# Patient Record
Sex: Male | Born: 1990
Health system: Southern US, Community
[De-identification: ages and names within clinical notes are randomized; demographics above are authoritative.]

## PROBLEM LIST (undated history)

## (undated) DIAGNOSIS — E7800 Pure hypercholesterolemia, unspecified: Secondary | ICD-10-CM

---

## 2012-06-09 ENCOUNTER — Ambulatory Visit
Admission: RE | Admit: 2012-06-09 | Discharge: 2012-06-09 | Disposition: A | Discharge status: Home / Self Care | Payer: No Typology Code available for payment source | Source: Ambulatory Visit | Attending: Occupational Medicine | Admitting: Occupational Medicine

## 2012-06-09 ENCOUNTER — Other Ambulatory Visit: Payer: Self-pay | Admitting: Occupational Medicine

## 2012-06-09 DIAGNOSIS — Z0000 Encounter for general adult medical examination without abnormal findings: Secondary | ICD-10-CM

## 2013-06-30 ENCOUNTER — Emergency Department (HOSPITAL_COMMUNITY): Payer: Medicaid - Out of State

## 2013-06-30 ENCOUNTER — Emergency Department (HOSPITAL_COMMUNITY)
Admission: EM | Admit: 2013-06-30 | Discharge: 2013-07-01 | Disposition: A | Discharge status: Home / Self Care | Payer: Medicaid - Out of State | Attending: Emergency Medicine | Admitting: Emergency Medicine

## 2013-06-30 ENCOUNTER — Encounter (HOSPITAL_COMMUNITY): Payer: Self-pay | Admitting: Emergency Medicine

## 2013-06-30 DIAGNOSIS — F17200 Nicotine dependence, unspecified, uncomplicated: Secondary | ICD-10-CM | POA: Insufficient documentation

## 2013-06-30 DIAGNOSIS — G43909 Migraine, unspecified, not intractable, without status migrainosus: Secondary | ICD-10-CM | POA: Insufficient documentation

## 2013-06-30 DIAGNOSIS — R51 Headache: Secondary | ICD-10-CM

## 2013-06-30 DIAGNOSIS — R519 Headache, unspecified: Secondary | ICD-10-CM

## 2013-06-30 DIAGNOSIS — M5430 Sciatica, unspecified side: Secondary | ICD-10-CM | POA: Insufficient documentation

## 2013-06-30 DIAGNOSIS — R0789 Other chest pain: Secondary | ICD-10-CM | POA: Insufficient documentation

## 2013-06-30 DIAGNOSIS — E7800 Pure hypercholesterolemia, unspecified: Secondary | ICD-10-CM | POA: Insufficient documentation

## 2013-06-30 HISTORY — DX: Pure hypercholesterolemia, unspecified: E78.00

## 2013-06-30 MED ORDER — KETOROLAC TROMETHAMINE 15 MG/ML IJ SOLN
30.0000 mg | Freq: Once | INTRAMUSCULAR | Status: DC
  Filled 2013-06-30: qty 2

## 2013-06-30 MED ORDER — SODIUM CHLORIDE 0.9 % IV BOLUS (SEPSIS)
1000.0000 mL | Freq: Once | INTRAVENOUS | Status: AC
  Administered 2013-06-30: 1000 mL via INTRAVENOUS

## 2013-06-30 NOTE — ED Notes (Signed)
Pt reports that he was playing video games last night and began to have intermittent L arm and leg numbness, pt reports he also had a HA. Denies pain at this time, denies numbness but states if he sits in one spot for too long he will begin to have numbness in his L buttocks. Pt also states that he has been having intermittent CP for the past few months, EKG completed by PCP and found no acute findings. Pt denies CP at this time. Pt a&o x4, ambulatory to triage.

## 2013-06-30 NOTE — ED Provider Notes (Signed)
CSN: [id number]     Arrival date & time 06/30/13  1928 History   First MD Initiated Contact with Patient 06/30/13 2128     Chief Complaint  Patient presents with  . Numbness  . Headache     (Consider location/radiation/quality/duration/timing/severity/associated sxs/prior Treatment) The history is provided by the patient. No language interpreter was used.  Joseph Rodriguez is a 23 y/o M with PMhx of migraines and high cholesterol presenting to the ED with headaches and tingling to the left side of the body. Patient reported that at approximately 1:30 AM last night while playing video games, reported that he experienced tingling to the left arm followed by the left lower extremity. Stated that tingling lasted approximately 5 minutes. Reported that he has not had an episode of tingling in his left upper arm - this only happened once, but stated that when he sits down for a long period of time he gets tingling to the posterior aspect of his left leg. Stated that the discomfort is a pins and needles sensation - reported that he is able to move the leg and apply pressure - reported that walking on the leg makes the pain feel better and the tingling goes away shortly with walking. Reported that he has been experiencing intermittent headaches localized to the left side of the head described as a sharp pain that lasts no more than 10 minutes - reported that he feels better once he drinks water. Reported that he had history of migraines, but stated that he has not had them in a year. Patient reported that he has been having chest pain localized to the left side - stated that the discomfort occurred with the tingling last night - lasted a short period of time. Reported that he has had history of chest pain for the past couple of years - reported similar symptoms. Denied blurred vision, sudden loss of vision, back injury, head trauma, back pain, urinary bowel continence, weakness, chest pain, shortness of breath,  difficulty breathing. PCP none  Past Medical History  Diagnosis Date  . High cholesterol    History reviewed. No pertinent past surgical history. History reviewed. No pertinent family history. History  Substance Use Topics  . Smoking status: Current Every Day Smoker -- 0.25 packs/day    Types: Cigarettes  . Smokeless tobacco: Never Used  . Alcohol Use: Yes    Review of Systems  Constitutional: Negative for fever and chills.  Eyes: Negative for visual disturbance.  Respiratory: Positive for chest tightness. Negative for shortness of breath.   Cardiovascular: Negative for chest pain.  Gastrointestinal: Negative for nausea, vomiting and abdominal pain.  Genitourinary: Negative for urgency.  Musculoskeletal: Negative for back pain and neck pain.  Neurological: Positive for numbness and headaches. Negative for dizziness and weakness.  All other systems reviewed and are negative.      Allergies  Review of patient's allergies indicates no known allergies.  Home Medications   Current Outpatient Rx  Name  Route  Sig  Dispense  Refill  . ibuprofen (ADVIL,MOTRIN) 200 MG tablet   Oral   Take 200 mg by mouth every 6 (six) hours as needed (pain).         . cyclobenzaprine (FLEXERIL) 10 MG tablet   Oral   Take 1 tablet (10 mg total) by mouth 2 (two) times daily as needed for muscle spasms.   20 tablet   0   . ibuprofen (ADVIL,MOTRIN) 800 MG tablet   Oral   Take 1  tablet (800 mg total) by mouth 3 (three) times daily.   21 tablet   0    BP 145/69  Pulse 72  Temp(Src) 98.8 F (37.1 C) (Oral)  Resp 18  Ht 5\' 10"  (1.778 m)  SpO2 99% Physical Exam  Vitals reviewed. Constitutional: He is oriented to person, place, and time. He appears well-developed and well-nourished. No distress.  HENT:  Head: Normocephalic and atraumatic.  Mouth/Throat: Oropharynx is clear and moist. No oropharyngeal exudate.  Negative facial swelling or abnormalities noted  Eyes: Conjunctivae and  EOM are normal. Pupils are equal, round, and reactive to light. Right eye exhibits no discharge. Left eye exhibits no discharge.  Negative nystagmus Visual fields grossly intact   Neck: Normal range of motion. Neck supple. No tracheal deviation present.  Cardiovascular: Normal rate, regular rhythm and normal heart sounds.  Exam reveals no friction rub.   No murmur heard. Pulses:      Radial pulses are 2+ on the right side, and 2+ on the left side.       Dorsalis pedis pulses are 2+ on the right side.  Pulmonary/Chest: Effort normal and breath sounds normal. No respiratory distress. He has no wheezes. He has no rales.  Musculoskeletal: Normal range of motion.  Negative deformities note to the lower extremities bilaterally. Negative pain upon palpation to the posterior aspect of the left lower extremity. Full ROM to upper and lower extremities without difficulty noted, negative ataxia noted.  Lymphadenopathy:    He has no cervical adenopathy.  Neurological: He is alert and oriented to person, place, and time. He exhibits normal muscle tone. Coordination normal.  Cranial nerves III-XII grossly intact Negative facial drooping Negative slurred speech Strength 5+/5+ to upper and lower extremities bilaterally with resistance applied, equal distribution noted Negative bilateral saddle paresthesias Sensation intact to upper and lower extremities bilaterally with differentiation to sharp and dull touch   Skin: Skin is warm and dry. No rash noted. He is not diaphoretic. No erythema.  Psychiatric: He has a normal mood and affect. His behavior is normal. Thought content normal.    ED Course  Procedures (including critical care time)  Dg Cervical Spine Complete  06/30/2013   CLINICAL DATA:  Left-sided neck pain and tingling sensation, extending into the left arm.  EXAM: CERVICAL SPINE  4+ VIEWS  COMPARISON:  None.  FINDINGS: There is no evidence of fracture or subluxation. Vertebral bodies demonstrate  normal height and alignment. Intervertebral disc spaces are preserved. Prevertebral soft tissues are within normal limits. The provided odontoid view demonstrates no significant abnormality.  The visualized lung apices are clear.  IMPRESSION: No evidence of fracture or subluxation along the cervical spine.   Electronically Signed   By: Roanna Raider M.D.   On: 06/30/2013 22:58   Dg Lumbar Spine Complete  06/30/2013   CLINICAL DATA:  Lower back pain.  EXAM: LUMBAR SPINE - COMPLETE 4+ VIEW  COMPARISON:  None.  FINDINGS: There is no evidence of fracture or subluxation. Vertebral bodies demonstrate normal height and alignment. Intervertebral disc spaces are preserved. The visualized neural foramina are grossly unremarkable in appearance.  The visualized bowel gas pattern is unremarkable in appearance; air and stool are noted within the colon. The sacroiliac joints are within normal limits.  IMPRESSION: No evidence of fracture or subluxation along the lumbar spine.   Electronically Signed   By: Roanna Raider M.D.   On: 06/30/2013 22:59   Ct Head Wo Contrast  06/30/2013   CLINICAL DATA:  Headache and numbness.  EXAM: CT HEAD WITHOUT CONTRAST  TECHNIQUE: Contiguous axial images were obtained from the base of the skull through the vertex without intravenous contrast.  COMPARISON:  None.  FINDINGS: There is no evidence of acute infarction, mass lesion, or intra- or extra-axial hemorrhage on CT.  The posterior fossa, including the cerebellum, brainstem and fourth ventricle, is within normal limits. The third and lateral ventricles, and basal ganglia are unremarkable in appearance. The cerebral hemispheres are symmetric in appearance, with normal gray-white differentiation. No mass effect or midline shift is seen.  There is no evidence of fracture; visualized osseous structures are unremarkable in appearance. The orbits are within normal limits. The paranasal sinuses and mastoid air cells are well-aerated. No  significant soft tissue abnormalities are seen.  IMPRESSION: Unremarkable noncontrast CT of the head.   Electronically Signed   By: Roanna RaiderJeffery  Chang M.D.   On: 06/30/2013 22:56   Labs Review Labs Reviewed - No data to display Imaging Review Dg Cervical Spine Complete  06/30/2013   CLINICAL DATA:  Left-sided neck pain and tingling sensation, extending into the left arm.  EXAM: CERVICAL SPINE  4+ VIEWS  COMPARISON:  None.  FINDINGS: There is no evidence of fracture or subluxation. Vertebral bodies demonstrate normal height and alignment. Intervertebral disc spaces are preserved. Prevertebral soft tissues are within normal limits. The provided odontoid view demonstrates no significant abnormality.  The visualized lung apices are clear.  IMPRESSION: No evidence of fracture or subluxation along the cervical spine.   Electronically Signed   By: Roanna RaiderJeffery  Chang M.D.   On: 06/30/2013 22:58   Dg Lumbar Spine Complete  06/30/2013   CLINICAL DATA:  Lower back pain.  EXAM: LUMBAR SPINE - COMPLETE 4+ VIEW  COMPARISON:  None.  FINDINGS: There is no evidence of fracture or subluxation. Vertebral bodies demonstrate normal height and alignment. Intervertebral disc spaces are preserved. The visualized neural foramina are grossly unremarkable in appearance.  The visualized bowel gas pattern is unremarkable in appearance; air and stool are noted within the colon. The sacroiliac joints are within normal limits.  IMPRESSION: No evidence of fracture or subluxation along the lumbar spine.   Electronically Signed   By: Roanna RaiderJeffery  Chang M.D.   On: 06/30/2013 22:59   Ct Head Wo Contrast  06/30/2013   CLINICAL DATA:  Headache and numbness.  EXAM: CT HEAD WITHOUT CONTRAST  TECHNIQUE: Contiguous axial images were obtained from the base of the skull through the vertex without intravenous contrast.  COMPARISON:  None.  FINDINGS: There is no evidence of acute infarction, mass lesion, or intra- or extra-axial hemorrhage on CT.  The posterior  fossa, including the cerebellum, brainstem and fourth ventricle, is within normal limits. The third and lateral ventricles, and basal ganglia are unremarkable in appearance. The cerebral hemispheres are symmetric in appearance, with normal gray-white differentiation. No mass effect or midline shift is seen.  There is no evidence of fracture; visualized osseous structures are unremarkable in appearance. The orbits are within normal limits. The paranasal sinuses and mastoid air cells are well-aerated. No significant soft tissue abnormalities are seen.  IMPRESSION: Unremarkable noncontrast CT of the head.   Electronically Signed   By: Roanna RaiderJeffery  Chang M.D.   On: 06/30/2013 22:56    EKG Interpretation   None       MDM   Final diagnoses:  Sciatica  Headache   Medications  ketorolac (TORADOL) 15 MG/ML injection 30 mg (30 mg Intravenous Not Given 06/30/13 2302)  sodium  chloride 0.9 % bolus 1,000 mL (1,000 mLs Intravenous New Bag/Given 06/30/13 2301)   Filed Vitals:   06/30/13 2011  BP: 145/69  Pulse: 72  Temp: 98.8 F (37.1 C)  TempSrc: Oral  Resp: 18  Height: 5\' 10"  (1.778 m)  SpO2: 99%    Patient presenting to the ED with with tingling to the left side of the body that occurred last night at approximately 1:30 AM while the patient was playing video games. Patient reported that the tingling to the left arm only happened last night - has not re-occurred. Patient reported that every time he sits down for a long period of time he gets tingling, pins and needles sensation in the posterior aspect of his left leg. Patient reported that he has been having intermittent headaches described as a sharp pain that last only a short 10 minutes, better with water. Reported that he has history of migraines. Reported chest pain that occurred last night with the onset of the tingling to the LUE - patient reported that he has history of chest pain for many years. Denied urinary bowel continence, back pain, back  injury, blurred vision, sudden loss of vision, weakness. Alert and oriented. GCS 15. Heart rate and rhythm normal. Lungs clear to auscultation to upper and lower lobes. Radial and DP pulses 2+ bilaterally. Cap refill less than 3 seconds. Negative facial abnormalities identified. Negative pain upon palpation to the scalp or facial aspect. Negative nystagmus, and visual fields grossly intact. Cranial nerves grossly intact. Full range of motion to upper lower tremors bilaterally without difficulty or ataxia noted. Strength intact with equal distribution-resistance applied, equal grip strength. Negative bilateral saddle paresthesias. Sensation intact with differentiation sharp and dull touch to upper and lower extremities bilaterally. EKG noted normal sinus rhythm with a heart rate of 73 beats per minute-negative ischemic findings noted. Cervical plain film negative for acute fracture or subluxation. Lumbar plain films negative findings-negative acute osseous injury noted. CT head negative intracranial abnormalities noted. Doubt cauda equina. Doubt epidural abscess. Doubt ICH. Doubt SAH. Doubt stroke. Doubt meningitis. Suspicion to be typical headache with possible sciatica. Negative focal neurological deficits noted. Patient stable, afebrile. Discharged patient with anti-inflammatories and muscle relaxer. Referred patient to orthopedics. Resource guide given. Discussed with patient to rest and stay hydrated. Discussed with patient to closely monitor symptoms and if symptoms are to worsen or change to report back to the ED - strict return instructions given.  Patient agreed to plan of care, understood, all questions answered.   Raymon Mutton, PA-C 07/01/13 1549

## 2013-07-01 MED ORDER — IBUPROFEN 800 MG PO TABS: 800.0000 mg | ORAL_TABLET | Freq: Three times a day (TID) | ORAL | Status: DC

## 2013-07-01 MED ORDER — CYCLOBENZAPRINE HCL 10 MG PO TABS: 10.0000 mg | ORAL_TABLET | Freq: Two times a day (BID) | ORAL | Status: DC | PRN

## 2013-07-01 NOTE — Discharge Instructions (Signed)
Please call and set-up an appointment with neurosurgery and orthopedics regarding leg discomfort Please rest and stay hydrated Please avoid any physical or strenuous activity Please take medications as prescribed Please continue to monitor symptoms closely and if symptoms are to worsen or change (fever greater than 101, chills, sweating, nausea, vomiting, diarrhea, chest pain, shortness of breath, difficulty breathing, numbness, tingling, fall, inability to control urine or bowel movements, headaches become more frequent, blurred vision, sudden loss of vision, weakness, trauma) please report back to the ED immediately  Sciatica Sciatica is pain, weakness, numbness, or tingling along the path of the sciatic nerve. The nerve starts in the lower back and runs down the back of each leg. The nerve controls the muscles in the lower leg and in the back of the knee, while also providing sensation to the back of the thigh, lower leg, and the sole of your foot. Sciatica is a symptom of another medical condition. For instance, nerve damage or certain conditions, such as a herniated disk or bone spur on the spine, pinch or put pressure on the sciatic nerve. This causes the pain, weakness, or other sensations normally associated with sciatica. Generally, sciatica only affects one side of the body. CAUSES   Herniated or slipped disc.  Degenerative disk disease.  A pain disorder involving the narrow muscle in the buttocks (piriformis syndrome).  Pelvic injury or fracture.  Pregnancy.  Tumor (rare). SYMPTOMS  Symptoms can vary from mild to very severe. The symptoms usually travel from the low back to the buttocks and down the back of the leg. Symptoms can include:  Mild tingling or dull aches in the lower back, leg, or hip.  Numbness in the back of the calf or sole of the foot.  Burning sensations in the lower back, leg, or hip.  Sharp pains in the lower back, leg, or hip.  Leg weakness.  Severe  back pain inhibiting movement. These symptoms may get worse with coughing, sneezing, laughing, or prolonged sitting or standing. Also, being overweight may worsen symptoms. DIAGNOSIS  Your caregiver will perform a physical exam to look for common symptoms of sciatica. He or she may ask you to do certain movements or activities that would trigger sciatic nerve pain. Other tests may be performed to find the cause of the sciatica. These may include:  Blood tests.  X-rays.  Imaging tests, such as an MRI or CT scan. TREATMENT  Treatment is directed at the cause of the sciatic pain. Sometimes, treatment is not necessary and the pain and discomfort goes away on its own. If treatment is needed, your caregiver may suggest:  Over-the-counter medicines to relieve pain.  Prescription medicines, such as anti-inflammatory medicine, muscle relaxants, or narcotics.  Applying heat or ice to the painful area.  Steroid injections to lessen pain, irritation, and inflammation around the nerve.  Reducing activity during periods of pain.  Exercising and stretching to strengthen your abdomen and improve flexibility of your spine. Your caregiver may suggest losing weight if the extra weight makes the back pain worse.  Physical therapy.  Surgery to eliminate what is pressing or pinching the nerve, such as a bone spur or part of a herniated disk. HOME CARE INSTRUCTIONS   Only take over-the-counter or prescription medicines for pain or discomfort as directed by your caregiver.  Apply ice to the affected area for 20 minutes, 3 4 times a day for the first 48 72 hours. Then try heat in the same way.  Exercise, stretch, or  perform your usual activities if these do not aggravate your pain.  Attend physical therapy sessions as directed by your caregiver.  Keep all follow-up appointments as directed by your caregiver.  Do not wear high heels or shoes that do not provide proper support.  Check your mattress to  see if it is too soft. A firm mattress may lessen your pain and discomfort. SEEK IMMEDIATE MEDICAL CARE IF:   You lose control of your bowel or bladder (incontinence).  You have increasing weakness in the lower back, pelvis, buttocks, or legs.  You have redness or swelling of your back.  You have a burning sensation when you urinate.  You have pain that gets worse when you lie down or awakens you at night.  Your pain is worse than you have experienced in the past.  Your pain is lasting longer than 4 weeks.  You are suddenly losing weight without reason. MAKE SURE YOU:  Understand these instructions.  Will watch your condition.  Will get help right away if you are not doing well or get worse. Document Released: 04/17/2001 Document Revised: 10/23/2011 Document Reviewed: 09/02/2011 [facility] Patient Information 2014 [place], [place].   Emergency Department Resource Guide 1) Find a Doctor and Pay Out of Pocket Although you won't have to find out who is covered by your insurance plan, it is a good idea to ask around and get recommendations. You will then need to call the office and see if the doctor you have chosen will accept you as a new patient and what types of options they offer for patients who are self-pay. Some doctors offer discounts or will set up payment plans for their patients who do not have insurance, but you will need to ask so you aren't surprised when you get to your appointment.  2) Contact Your Local Health Department Not all health departments have doctors that can see patients for sick visits, but many do, so it is worth a call to see if yours does. If you don't know where your local health department is, you can check in your phone book. The CDC also has a tool to help you locate your state's health department, and many state websites also have listings of all of their local health departments.  3) Find a Walk-in Clinic If your illness is not likely to be very  severe or complicated, you may want to try a walk in clinic. These are popping up all over the country in pharmacies, drugstores, and shopping centers. They're usually staffed by nurse practitioners or physician assistants that have been trained to treat common illnesses and complaints. They're usually fairly quick and inexpensive. However, if you have serious medical issues or chronic medical problems, these are probably not your best option.  No Primary Care Doctor: - Call Health Connect at  [phone] - they can help you locate a primary care doctor that  accepts your insurance, provides certain services, etc. - Physician Referral Service- [phone]  Chronic Pain Problems: Organization         Address  Phone   Notes  [facility]  [phone] Patients need to be referred by their primary care doctor.   Medication Assistance: Organization         Address  Phone   Notes  [facility] Medication [facility] [address] [place] [place], [place] [address] [phone] --Must be a resident of [facility] -- Must have NO insurance coverage whatsoever (no Medicaid/ Medicare,  etc.) -- The pt. MUST have a primary care doctor that directs their care regularly and follows them in the community   MedAssist  [phone]   [employer]  [phone]    Agencies that provide inexpensive medical care: Organization         Address  Phone   Notes  [facility]  [phone]) [phone]   [facility]    [phone]   [facility] [address] [place], [place] [address] [phone]) [phone]   [facility] [place] [address], [place] [phone]) [phone]   [facility]anned Parenthood    [phone](336) [phone]   [facility]    [phone]   [facility] and [facility]  [address], [place] Phone:  [phone], Fax:  [phone]) [phone] Hours of Operation:  9 am - 6 pm, M-F.  Also accepts  Medicaid/Medicare and self-pay.  [facility]  [address], [place] Phone: [phone](336) [phone], Fax: [phone]. Hours of Operation:  8:30 am - 5:30 pm, M-F.  Also accepts Medicaid and self-pay.  [facility] [place] [address], [place] Phone: [phone]   [facility] [address], [place], [place] [phone], Ext. [phone] Mondays & Thursdays: 7-9 AM.  First 15 patients are seen on a first come, first serve basis.    Medicaid-accepting [facility] Providers:  Organization         Address  Phone   Notes  [facility] [address], [place] [phone] Also accepts self-pay patients.  [facility] [address] [place], [place]  [phone]) [phone]   [facility] [address], [place] [phone]) [phone]   [facility] [address] [place] [address], [place] [phone]   Renaye RakersVeita Bland [address], [place]   [phone]) [phone] Only accepts [place] Access [place]icaid patients after they have their name applied to their card.   Self-Pay (no insurance) in [facility]:  Organization         Address  Phone   Notes  Sickle Cell Patients, [facility] [address], [place] [phone]) [phone]   [facility] [address], [place] [phone](336) [phone]   [facility] [place] [facility]  [address], [place] [phone]   [facility]/Dr. Osei-Bonsu  [address] [place] [address], [place] or [phone], [place] [phone] Phone number for both [place] and [place] locations is the same.  [facility] [address], [place] [phone]) [phone]   [facility] [place] [address] [place] [address], [place] or [address] [phone]   [facility] [address], [place] [phone])  [phone], phone; [phone]) [phone], fax Sees patients 1st and 3rd Saturday of every month.  Must not qualify for public or private insurance (i.e. Medicaid, Medicare, [place] Health Choice, Veterans' Benefits)  Household income should be no more than 200% of the poverty level The clinic cannot treat you if you are pregnant or think you are pregnant  Sexually transmitted diseases are not treated at the clinic.    Dental Care: Organization         Address  Phone  Notes  [facility] [address], [place] [phone] Accepts children up to age 24 who are enrolled in [place]icaid or [place] Health Choice; pregnant women with a Medicaid card; and children who have applied for Medicaid or [place] Health Choice, but were declined, whose parents can pay a reduced fee at time of service.  [facility] of [facility] [place]  [address], [place] [phone]) [phone] Accepts children up to age 821 who are enrolled in [place]icaid or [place] Health Choice; pregnant women with a Medicaid card; and children who have applied for Medicaid or [place] Health Choice, but were declined, whose parents can pay a reduced fee at time of service.  [facility]  [address], [place] [phone]) [phone] Patients are seen by appointment only. Walk-ins are not accepted. [facility] will see patients 24 years of age and older. Monday - Tuesday (8am-5pm) Most Wednesdays (8:30-5pm) $30 per visit, cash only  [facility]  [address], [facility] [phone] Patients are seen by appointment only. Walk-ins are not accepted. [facility] will see patients 24 years of age and older. One Wednesday Evening (Monthly: Volunteer Based).  $30 per visit, cash only  [facility]  [phone] for adults; Children under age 834, call Graduate Pediatric Dentistry at [phone]. Children aged  614-14, please call [phone] to request a pediatric application.  Dental services are provided in all areas of dental care including fillings, crowns and bridges, complete and partial dentures, implants, gum treatment, root canals, and extractions. Preventive care is also provided. Treatment is provided to both adults and children. Patients are selected via a lottery and there is often a waiting list.   [facility] [address], [place]  [phone] [email or web address]   [facility] [address], [place], [place] [phone], Ext. [phone] Second and Fourth Thursday of each month, opens at 6:30 AM; Clinic ends at 9 AM.  Patients are seen on a first-come first-served basis, and a limited number are seen during each clinic.   [facility]  [address], [place], [place] [phone]) [phone]   Eligibility Requirements You must have lived in [place], [place], or [place] counties for at least the last three months.   You cannot be eligible for state or federal sponsored National Cityhealthcare insurance, including CIGNAVeterans Administration, [place]icaid, or [employer] EntertainmentMedicare.   You generally cannot be eligible for healthcare insurance through your employer.    How to apply: Eligibility screenings are held every Tuesday and Wednesday afternoon from 1:00 pm until 4:00 pm. You do not need an appointment for the interview!  [facility] [address], [place], [place] [phone]   [facility]  [phone]   [facility]  [phone]   [facility]  [phone]    Behavioral Health Resources in the Community: Intensive Outpatient Programs Organization         Address  Phone  Notes  [facility] [address], [place], [place] [phone]   [facility] [address], [place], [place] [phone]   [facility] [address],  [place], [place]  [phone]   [facility] [address],  [place], [place] [phone] or [phone]   Substance Abuse Resources Organization         Address  Phone  Notes  [facility]  [phone]   [facility]  [phone]   [facility]  [phone]   [facility]  [phone]   Residential & Outpatient Substance Abuse Program  [phone]   Psychological Services Organization         Address  Phone  Notes  [facility]  [phone]   [facility]  [phone]- [phone]   [facility] [address], [place] [phone] or [phone]    Mobile Crisis Teams Organization         Address  Phone  Notes  [facility], Mobile Crisis Care Unit  [phone]   [facility]  [address] [place], [place] [phone]   Doristine LocksSharon DeEsch [address] [place] [phone]    Self-Help/Support Groups Organization         Address  Phone             Notes  Mental Health Assoc. of  - variety of support groups  336- [phone] Call for more information  Narcotics Anonymous (NA), [facility] [address], [place]  2 meetings at this location   Statisticianesidential Treatment Programs Organization         Address  Phone  Notes  [facility] [address],    [place]  [phone]   [facility]  [address], [place] [phone], [place], [place] [phone]   [facility] [address], [place] [phone] Admissions: 8am-3pm M-F  [facility] [address]    [place], [place] [phone]   [facility] [address], [place], [place] [phone]   [facility] [address]  [place], [place] [phone]   [facility] - Intensive Outpatient [address], [place], [place] [phone]   [facility]  ([facility]iction Recovery [facility] [address]  [place], [place] [phone] or [phone]   [facility] ([facility]) [address] [place], [place] [phone] Accepts Medicaid  [facility] [address]  [place] [phone] Substance Abuse/Addiction Treatment   [facility]al Health Resources Organization         Address  Phone  Notes  [facility]  [phone]   Angie FavaJulie Brannon, PhD [address], [address] [place], [place]   [phone]) [phone] or [phone]   [facility]   [address] [place], [place] [phone](336) [phone]   [facility] [address], [place], [place] [phone](336) [phone] Insurance/Medicaid/sponsorship through [facility]  [facility] Families [address]                                    [place], [place] [phone]) [phone] Therapy/tele-psych/case  [facility] [address].   [place], [place] [phone](336) [phone]    Dr. Lolly MustacheArfeen  [phone]) [phone]   [facility]  [employer] [facility] 1) [address], [place] 2) [address], [place] 3)  [address], [place] [phone]  [phone]) [phone]   [facility] Child Abuse Hotline [phone]) [phone] or [phone](336) [phone] (After Hours)

## 2013-07-02 NOTE — ED Provider Notes (Signed)
Medical screening examination/treatment/procedure(s) were performed by non-physician practitioner and as supervising physician I was immediately available for consultation/collaboration.  EKG Interpretation   None         Audree CamelScott T Lorielle Boehning, MD 07/02/13 [phone]

## 2014-06-29 IMAGING — CT CT HEAD W/O CM
2 series · 15 of 30 positions shown, 19 images · non-contrast
Comparison: None.

CLINICAL DATA: Headache and numbness.

EXAM:
CT HEAD WITHOUT CONTRAST
TECHNIQUE: Contiguous axial images were obtained from the base of the skull
through the vertex without intravenous contrast.

[Series 2: head w/o · axial · non-contrast · 0.48mm/px · z∈[+1409,+1544]mm · 13 of 33 slices shown, 17 images]
[im 3/33  brain]
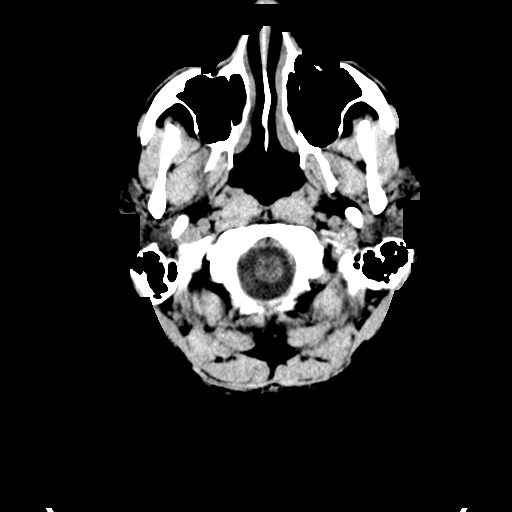
[im 3/33  bone]
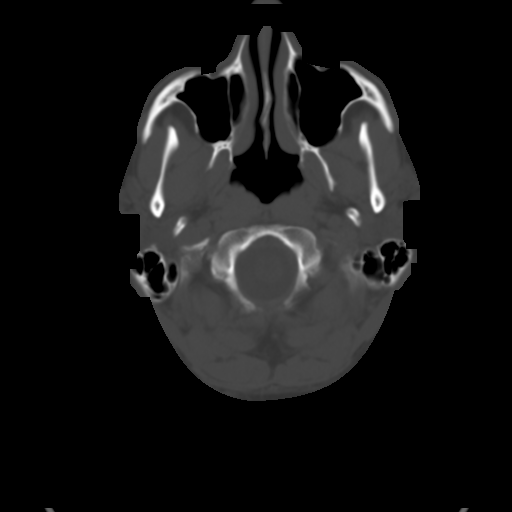
[im 5/33  brain]
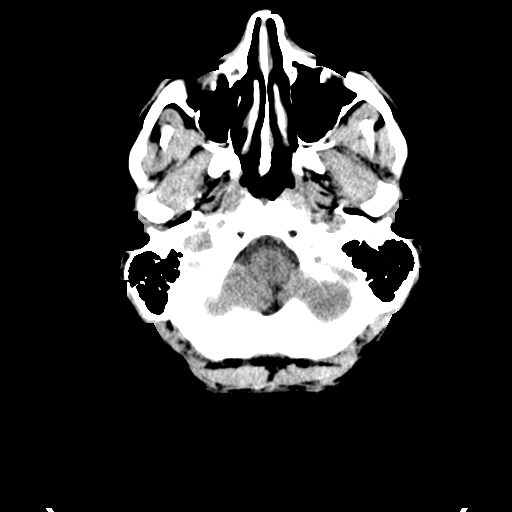
[im 7/33  brain]
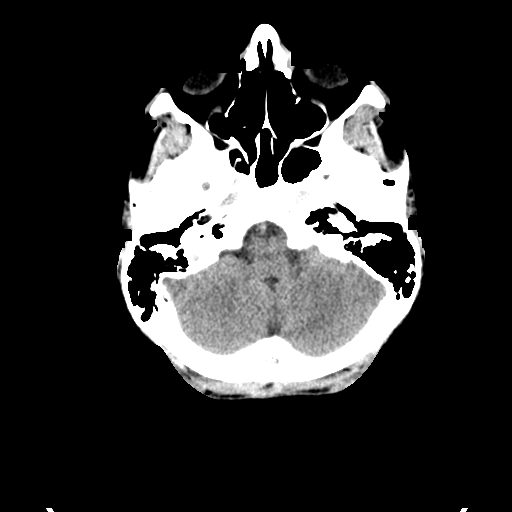
[im 10/33  brain]
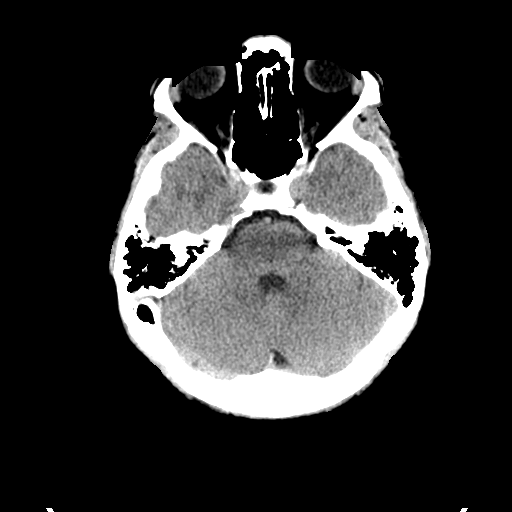
[im 12/33  brain]
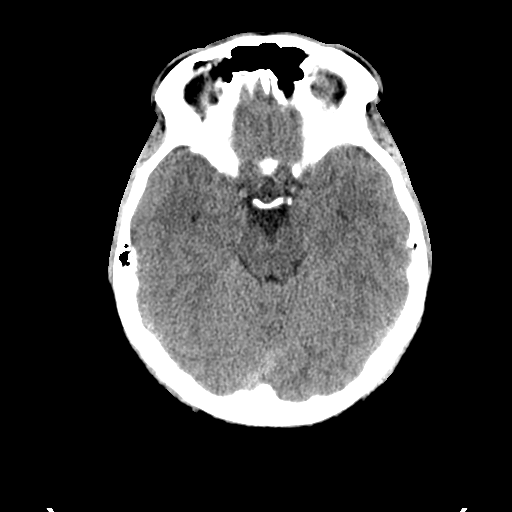
[im 12/33  bone]
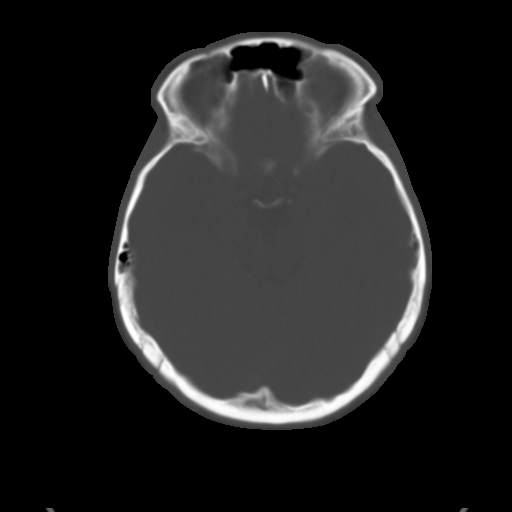
[im 14/33  brain]
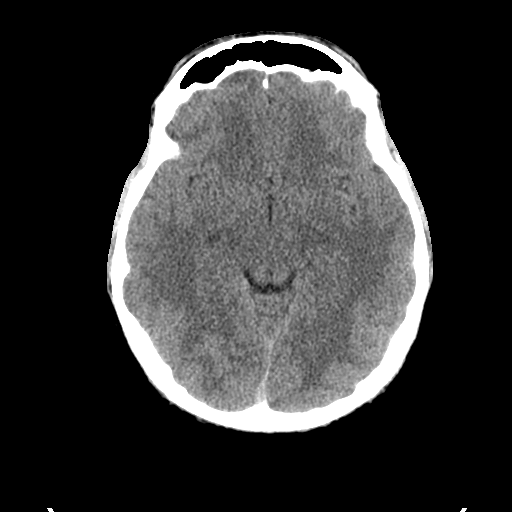
[im 17/33  brain]
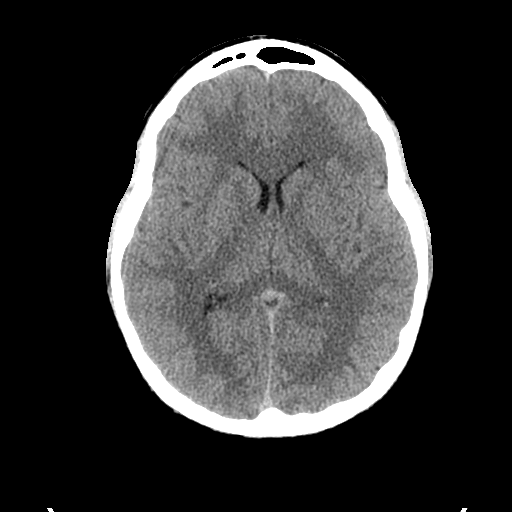
[im 19/33  brain]
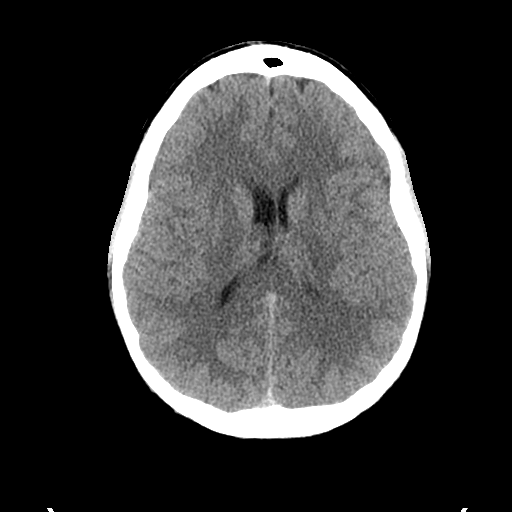
[im 21/33  brain]
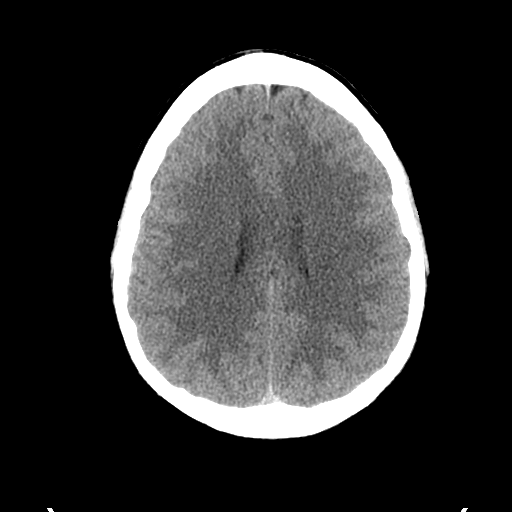
[im 21/33  bone]
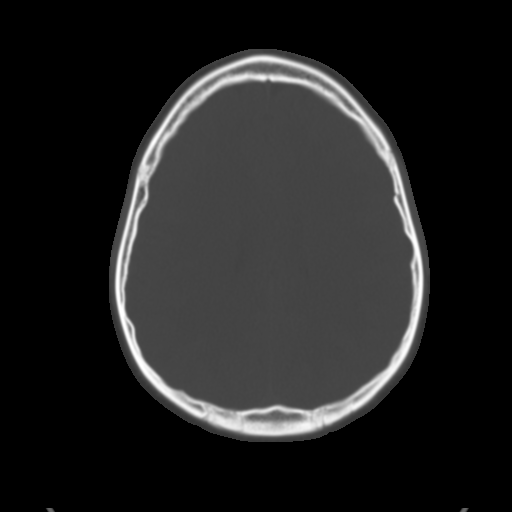
[im 23/33  brain]
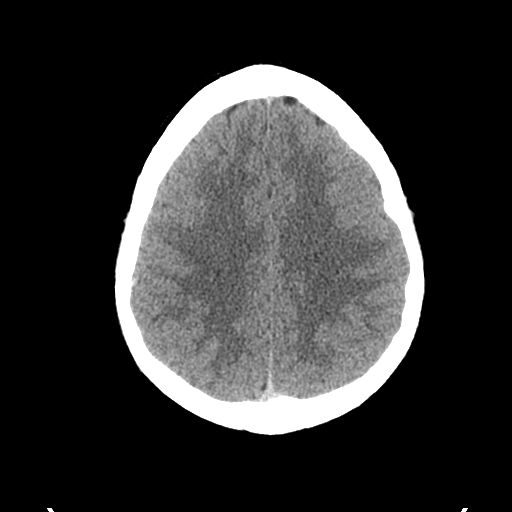
[im 26/33  brain]
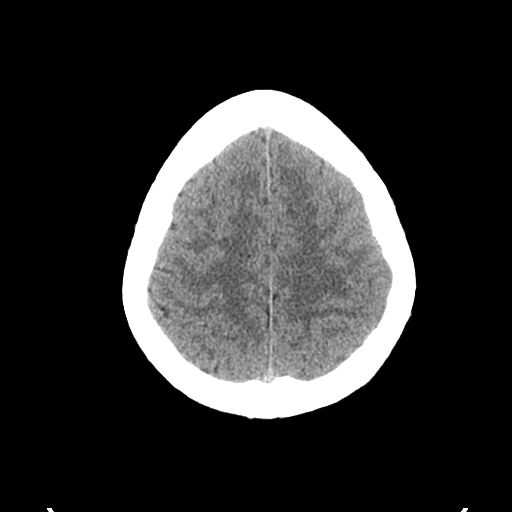
[im 28/33  brain]
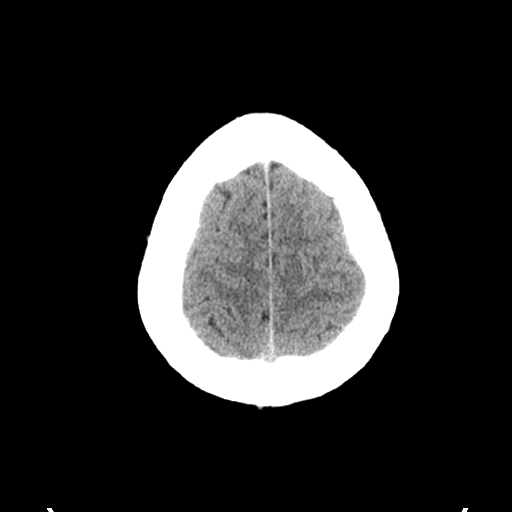
[im 30/33  brain]
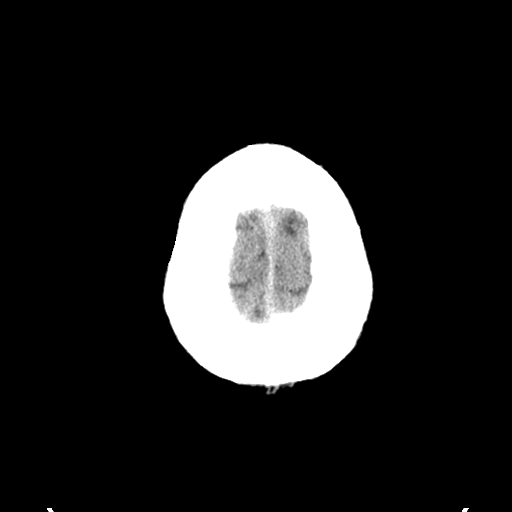
[im 30/33  bone]
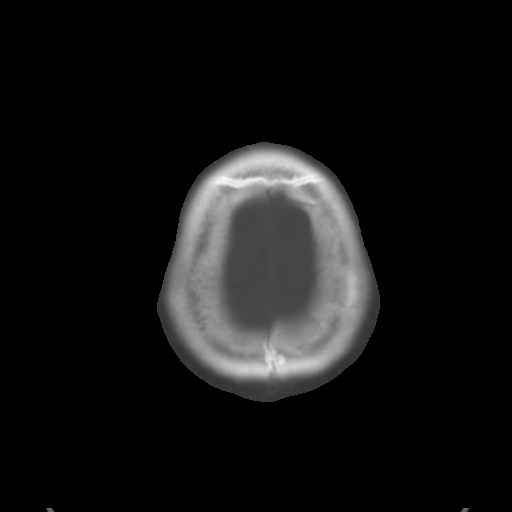

[Series 3: bone windows · axial · 0.48mm/px · z∈[+1409,+1429]mm · 2 of 33 slices shown]
[im 3/33  bone]
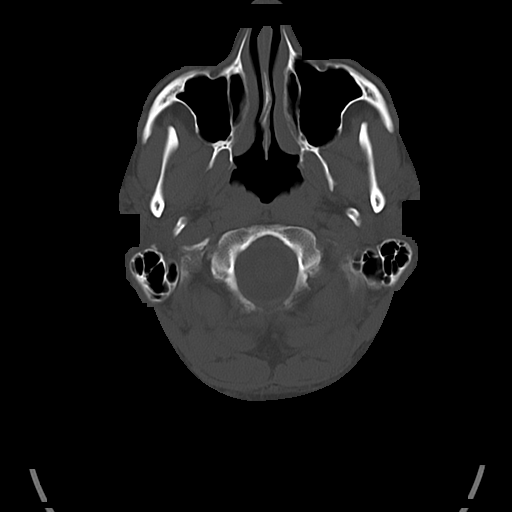
[im 7/33  bone]
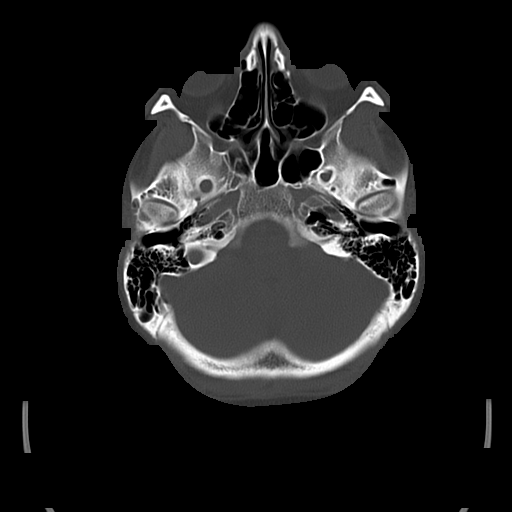

[15 of 30 positions shown; findings below may reference images not displayed]

FINDINGS: There is no evidence of acute infarction, mass lesion, or intra- or
extra-axial hemorrhage on CT.

The posterior fossa, including the cerebellum, brainstem and fourth
ventricle, is within normal limits. The third and lateral
ventricles, and basal ganglia are unremarkable in appearance. The
cerebral hemispheres are symmetric in appearance, with normal
gray-white differentiation. No mass effect or midline shift is seen.

There is no evidence of fracture; visualized osseous structures are
unremarkable in appearance. The orbits are within normal limits. The
paranasal sinuses and mastoid air cells are well-aerated. No
significant soft tissue abnormalities are seen.
IMPRESSION: Unremarkable noncontrast CT of the head.

## 2016-10-07 ENCOUNTER — Encounter (HOSPITAL_COMMUNITY): Payer: Self-pay | Admitting: Emergency Medicine

## 2016-10-07 ENCOUNTER — Ambulatory Visit (HOSPITAL_COMMUNITY)
Admission: EM | Admit: 2016-10-07 | Discharge: 2016-10-07 | Disposition: A | Discharge status: Home / Self Care | Payer: No Typology Code available for payment source | Attending: Internal Medicine | Admitting: Internal Medicine

## 2016-10-07 DIAGNOSIS — W57XXXA Bitten or stung by nonvenomous insect and other nonvenomous arthropods, initial encounter: Secondary | ICD-10-CM

## 2016-10-07 DIAGNOSIS — Z23 Encounter for immunization: Secondary | ICD-10-CM

## 2016-10-07 DIAGNOSIS — S80852A Superficial foreign body, left lower leg, initial encounter: Secondary | ICD-10-CM

## 2016-10-07 DIAGNOSIS — Z189 Retained foreign body fragments, unspecified material: Secondary | ICD-10-CM

## 2016-10-07 DIAGNOSIS — R21 Rash and other nonspecific skin eruption: Secondary | ICD-10-CM

## 2016-10-07 MED ORDER — TETANUS-DIPHTH-ACELL PERTUSSIS 5-2.5-18.5 LF-MCG/0.5 IM SUSP
INTRAMUSCULAR | Status: AC
  Filled 2016-10-07: qty 0.5

## 2016-10-07 MED ORDER — TETANUS-DIPHTH-ACELL PERTUSSIS 5-2.5-18.5 LF-MCG/0.5 IM SUSP
0.5000 mL | Freq: Once | INTRAMUSCULAR | Status: AC
  Administered 2016-10-07: 0.5 mL via INTRAMUSCULAR

## 2016-10-07 MED ORDER — LIDOCAINE-EPINEPHRINE (PF) 2 %-1:200000 IJ SOLN
INTRAMUSCULAR | Status: AC
  Filled 2016-10-07: qty 20

## 2016-10-07 NOTE — ED Triage Notes (Signed)
[facility] lower left [facility]. The patient stated that he removed part of the tick but feels the head is still present.

## 2016-10-07 NOTE — ED Notes (Signed)
Pt now consenting to receive Tdap.

## 2016-10-07 NOTE — Discharge Instructions (Signed)
If you have fever, chills, nausea, photophobia, new joint or muscle aches (aside from you shoulder pain from the tetanus shot) and/or ascending peripheral rash, then come back to the clinic.
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
